# Patient Record
Sex: Female | Born: 1950 | Race: White | Hispanic: No | Marital: Married | Smoking: Never smoker
Health system: Southern US, Community
[De-identification: ages and names within clinical notes are randomized; demographics above are authoritative.]

## PROBLEM LIST (undated history)

## (undated) DIAGNOSIS — R600 Localized edema: Secondary | ICD-10-CM

## (undated) DIAGNOSIS — C801 Malignant (primary) neoplasm, unspecified: Secondary | ICD-10-CM

## (undated) DIAGNOSIS — T8859XA Other complications of anesthesia, initial encounter: Secondary | ICD-10-CM

## (undated) DIAGNOSIS — M1990 Unspecified osteoarthritis, unspecified site: Secondary | ICD-10-CM

## (undated) DIAGNOSIS — K219 Gastro-esophageal reflux disease without esophagitis: Secondary | ICD-10-CM

## (undated) DIAGNOSIS — N814 Uterovaginal prolapse, unspecified: Secondary | ICD-10-CM

## (undated) DIAGNOSIS — K649 Unspecified hemorrhoids: Secondary | ICD-10-CM

## (undated) DIAGNOSIS — T4145XA Adverse effect of unspecified anesthetic, initial encounter: Secondary | ICD-10-CM

## (undated) DIAGNOSIS — I10 Essential (primary) hypertension: Secondary | ICD-10-CM

## (undated) HISTORY — PX: HEMORRHOID SURGERY: SHX153

## (undated) HISTORY — PX: ABDOMINAL HYSTERECTOMY: SHX81

## (undated) HISTORY — PX: RHINOPLASTY: SUR1284

---

## 1986-05-25 DIAGNOSIS — C801 Malignant (primary) neoplasm, unspecified: Secondary | ICD-10-CM

## 1986-05-25 HISTORY — DX: Malignant (primary) neoplasm, unspecified: C80.1

## 1986-05-25 HISTORY — PX: MASTECTOMY: SHX3

## 1997-11-19 ENCOUNTER — Other Ambulatory Visit: Admission: RE | Admit: 1997-11-19 | Discharge: 1997-11-19 | Payer: Self-pay | Admitting: Obstetrics & Gynecology

## 1998-06-14 ENCOUNTER — Ambulatory Visit (HOSPITAL_BASED_OUTPATIENT_CLINIC_OR_DEPARTMENT_OTHER): Admission: RE | Admit: 1998-06-14 | Discharge: 1998-06-14 | Payer: Self-pay | Admitting: Specialist

## 2000-06-14 ENCOUNTER — Encounter: Payer: Self-pay | Admitting: Hematology and Oncology

## 2000-06-14 ENCOUNTER — Encounter: Admission: RE | Admit: 2000-06-14 | Discharge: 2000-06-14 | Payer: Self-pay | Admitting: Hematology and Oncology

## 2001-03-17 ENCOUNTER — Other Ambulatory Visit: Admission: RE | Admit: 2001-03-17 | Discharge: 2001-03-17 | Payer: Self-pay | Admitting: Gynecology

## 2003-06-06 ENCOUNTER — Encounter: Admission: RE | Admit: 2003-06-06 | Discharge: 2003-06-06 | Payer: Self-pay | Admitting: Internal Medicine

## 2003-11-19 ENCOUNTER — Other Ambulatory Visit: Admission: RE | Admit: 2003-11-19 | Discharge: 2003-11-19 | Payer: Self-pay | Admitting: Gynecology

## 2011-12-04 ENCOUNTER — Emergency Department (HOSPITAL_COMMUNITY): Payer: BC Managed Care – PPO

## 2011-12-04 ENCOUNTER — Encounter (HOSPITAL_COMMUNITY): Payer: Self-pay | Admitting: *Deleted

## 2011-12-04 ENCOUNTER — Emergency Department (HOSPITAL_COMMUNITY)
Admission: EM | Admit: 2011-12-04 | Discharge: 2011-12-04 | Disposition: A | Discharge status: Home / Self Care | Payer: BC Managed Care – PPO | Attending: Emergency Medicine | Admitting: Emergency Medicine

## 2011-12-04 DIAGNOSIS — W010XXA Fall on same level from slipping, tripping and stumbling without subsequent striking against object, initial encounter: Secondary | ICD-10-CM | POA: Insufficient documentation

## 2011-12-04 DIAGNOSIS — I10 Essential (primary) hypertension: Secondary | ICD-10-CM | POA: Insufficient documentation

## 2011-12-04 DIAGNOSIS — S52123A Displaced fracture of head of unspecified radius, initial encounter for closed fracture: Secondary | ICD-10-CM

## 2011-12-04 DIAGNOSIS — Z853 Personal history of malignant neoplasm of breast: Secondary | ICD-10-CM | POA: Insufficient documentation

## 2011-12-04 HISTORY — DX: Malignant (primary) neoplasm, unspecified: C80.1

## 2011-12-04 HISTORY — DX: Unspecified hemorrhoids: K64.9

## 2011-12-04 HISTORY — DX: Localized edema: R60.0

## 2011-12-04 HISTORY — DX: Essential (primary) hypertension: I10

## 2011-12-04 MED ORDER — OXYCODONE-ACETAMINOPHEN 5-325 MG PO TABS
2.0000 | ORAL_TABLET | Freq: Once | ORAL | Status: AC
  Administered 2011-12-04: 2 via ORAL
  Filled 2011-12-04: qty 2

## 2011-12-04 MED ORDER — MORPHINE SULFATE 4 MG/ML IJ SOLN
4.0000 mg | Freq: Once | INTRAMUSCULAR | Status: AC
  Administered 2011-12-04: 4 mg via INTRAMUSCULAR

## 2011-12-04 MED ORDER — MORPHINE SULFATE 4 MG/ML IJ SOLN
INTRAMUSCULAR | Status: AC
  Administered 2011-12-04: 4 mg via INTRAMUSCULAR
  Filled 2011-12-04: qty 1

## 2011-12-04 MED ORDER — OXYCODONE-ACETAMINOPHEN 5-325 MG PO TABS: 1.0000 | ORAL_TABLET | ORAL | Status: AC | PRN

## 2011-12-04 MED ORDER — IBUPROFEN 200 MG PO TABS
400.0000 mg | ORAL_TABLET | Freq: Once | ORAL | Status: AC
  Administered 2011-12-04: 400 mg via ORAL
  Filled 2011-12-04: qty 2

## 2011-12-04 NOTE — ED Notes (Signed)
Ortho bedside

## 2011-12-04 NOTE — ED Notes (Signed)
Pt returns to room, cont to monitor, awaiting results

## 2011-12-04 NOTE — ED Notes (Signed)
Transported to xray 

## 2011-12-04 NOTE — ED Notes (Signed)
Per Pt: tripped on door mat and fell backward onto concrete and struck left elbow on concrete. Pt has hx of left upper extremity lymph node removals, so swelling is mostly baseline. Pt presents to ED with intact circulation and sensation, pt is unable to turn forearm into supine position. Limited movement, no obvious deformity. Pain 9/10

## 2011-12-10 NOTE — ED Provider Notes (Signed)
History    61yF with l elbow pain. Fall from standing height. Lost balance and fell backwards. Doesn't think hit head. No LOC. Denies HA, neck or back pain. No numbness or tingling. Happened just before arrival. Pain constant since.  CSN: [id number]  Arrival date & time 12/04/11  2035   First MD Initiated Contact with Patient 12/04/11 2052      Chief Complaint  Patient presents with  . Arm Injury  . Fall    (Consider location/radiation/quality/duration/timing/severity/associated sxs/prior treatment) HPI  Past Medical History  Diagnosis Date  . Cancer     breast  . Hemorrhoids   . Hypertension   . Extremity edema     left arm restricted    Past Surgical History  Procedure Date  . Mastectomy   . Abdominal hysterectomy   . Hemorrhoid surgery     History reviewed. No pertinent family history.  History  Substance Use Topics  . Smoking status: Never Smoker   . Smokeless tobacco: Not on file  . Alcohol Use: Yes    OB History    Grav Para Term Preterm Abortions TAB SAB Ect Mult Living                  Review of Systems   Review of symptoms negative unless otherwise noted in HPI.   Allergies  Codeine  Home Medications   Current Outpatient Rx  Name Route Sig Dispense Refill  . TRIAMTERENE-HCTZ 75-50 MG PO TABS Oral Take 0.5 tablets by mouth daily.    . OXYCODONE-ACETAMINOPHEN 5-325 MG PO TABS Oral Take 1-2 tablets by mouth every 4 (four) hours as needed for pain. 20 tablet 0    BP 140/103  Pulse 73  Temp 97.8 F (36.6 C) (Oral)  Resp 22  Ht 5\' 2"  (1.575 m)  Wt 145 lb (65.772 kg)  BMI 26.52 kg/m2  SpO2 98%  Physical Exam  Nursing note and vitals reviewed. Constitutional: She appears well-developed and well-nourished. No distress.  HENT:  Head: Normocephalic and atraumatic.  Eyes: Conjunctivae are normal. Right eye exhibits no discharge. Left eye exhibits no discharge.  Neck: Neck supple.  Cardiovascular: Normal rate, regular rhythm and normal  heart sounds.  Exam reveals no gallop and no friction rub.   No murmur heard. Pulmonary/Chest: Effort normal and breath sounds normal. No respiratory distress.  Abdominal: Soft. She exhibits no distension. There is no tenderness.  Musculoskeletal: She exhibits no edema and no tenderness.       No midline spinal tenderness. Diffuse L elbow pain.limited ROM with flex/ext and forearm supination/pronation 2/2 pain. Neurovascularly intact distally. Skin intact.  Neurological: She is alert.  Skin: Skin is warm and dry.  Psychiatric: She has a normal mood and affect. Her behavior is normal. Thought content normal.    ED Course  Procedures (including critical care time)  Labs Reviewed - No data to display No results found.  Dg Elbow Complete Left  12/04/2011  *RADIOLOGY REPORT*  Clinical Data: Fall.  LEFT ELBOW - COMPLETE 3+ VIEW  Comparison: None.  Findings: Radial head fracture with incongruity of the articular surface.  Coronoid process fracture.  Joint effusion.  IMPRESSION:  Radial head fracture with incongruity of the articular surface.  Coronoid process fracture.  Joint effusion.  Original Report Authenticated By: Fuller Canada, M.D.    1. Radial head fracture, closed   2. Coronoid Process Fracture    MDM  61yF with closed L radial head and coronoid process fractures. Neurovascularly intact.  Plan splint, pain meds and ortho fu.        Raeford Razor, MD 12/10/11 [phone]

## 2014-05-14 ENCOUNTER — Other Ambulatory Visit: Payer: Self-pay | Admitting: Internal Medicine

## 2014-05-14 ENCOUNTER — Ambulatory Visit
Admission: RE | Admit: 2014-05-14 | Discharge: 2014-05-14 | Disposition: A | Discharge status: Home / Self Care | Payer: BC Managed Care – PPO | Source: Ambulatory Visit | Attending: Internal Medicine | Admitting: Internal Medicine

## 2014-05-14 DIAGNOSIS — C50912 Malignant neoplasm of unspecified site of left female breast: Secondary | ICD-10-CM

## 2015-11-11 DIAGNOSIS — Z853 Personal history of malignant neoplasm of breast: Secondary | ICD-10-CM | POA: Diagnosis not present

## 2015-11-11 DIAGNOSIS — N8189 Other female genital prolapse: Secondary | ICD-10-CM | POA: Diagnosis not present

## 2015-11-11 DIAGNOSIS — Z6827 Body mass index (BMI) 27.0-27.9, adult: Secondary | ICD-10-CM | POA: Diagnosis not present

## 2015-11-11 DIAGNOSIS — N959 Unspecified menopausal and perimenopausal disorder: Secondary | ICD-10-CM | POA: Diagnosis not present

## 2015-11-13 DIAGNOSIS — M8588 Other specified disorders of bone density and structure, other site: Secondary | ICD-10-CM | POA: Diagnosis not present

## 2015-11-13 DIAGNOSIS — I10 Essential (primary) hypertension: Secondary | ICD-10-CM | POA: Diagnosis not present

## 2015-12-10 DIAGNOSIS — N939 Abnormal uterine and vaginal bleeding, unspecified: Secondary | ICD-10-CM | POA: Diagnosis not present

## 2016-02-04 DIAGNOSIS — H25043 Posterior subcapsular polar age-related cataract, bilateral: Secondary | ICD-10-CM | POA: Diagnosis not present

## 2016-02-04 DIAGNOSIS — H524 Presbyopia: Secondary | ICD-10-CM | POA: Diagnosis not present

## 2016-03-11 DIAGNOSIS — D225 Melanocytic nevi of trunk: Secondary | ICD-10-CM | POA: Diagnosis not present

## 2016-03-11 DIAGNOSIS — D2272 Melanocytic nevi of left lower limb, including hip: Secondary | ICD-10-CM | POA: Diagnosis not present

## 2016-03-11 DIAGNOSIS — L7211 Pilar cyst: Secondary | ICD-10-CM | POA: Diagnosis not present

## 2016-03-11 DIAGNOSIS — D1801 Hemangioma of skin and subcutaneous tissue: Secondary | ICD-10-CM | POA: Diagnosis not present

## 2016-03-11 DIAGNOSIS — L821 Other seborrheic keratosis: Secondary | ICD-10-CM | POA: Diagnosis not present

## 2016-04-07 IMAGING — CR DG CHEST 2V
2 series · 2 of 2 positions shown · non-contrast
Comparison: 09/18/2009

CLINICAL DATA: Personal history of mastectomy 27 years ago for
treatment of breast cancer. Routine checkup.

EXAM:
CHEST  2 VIEW

[w chest pa]
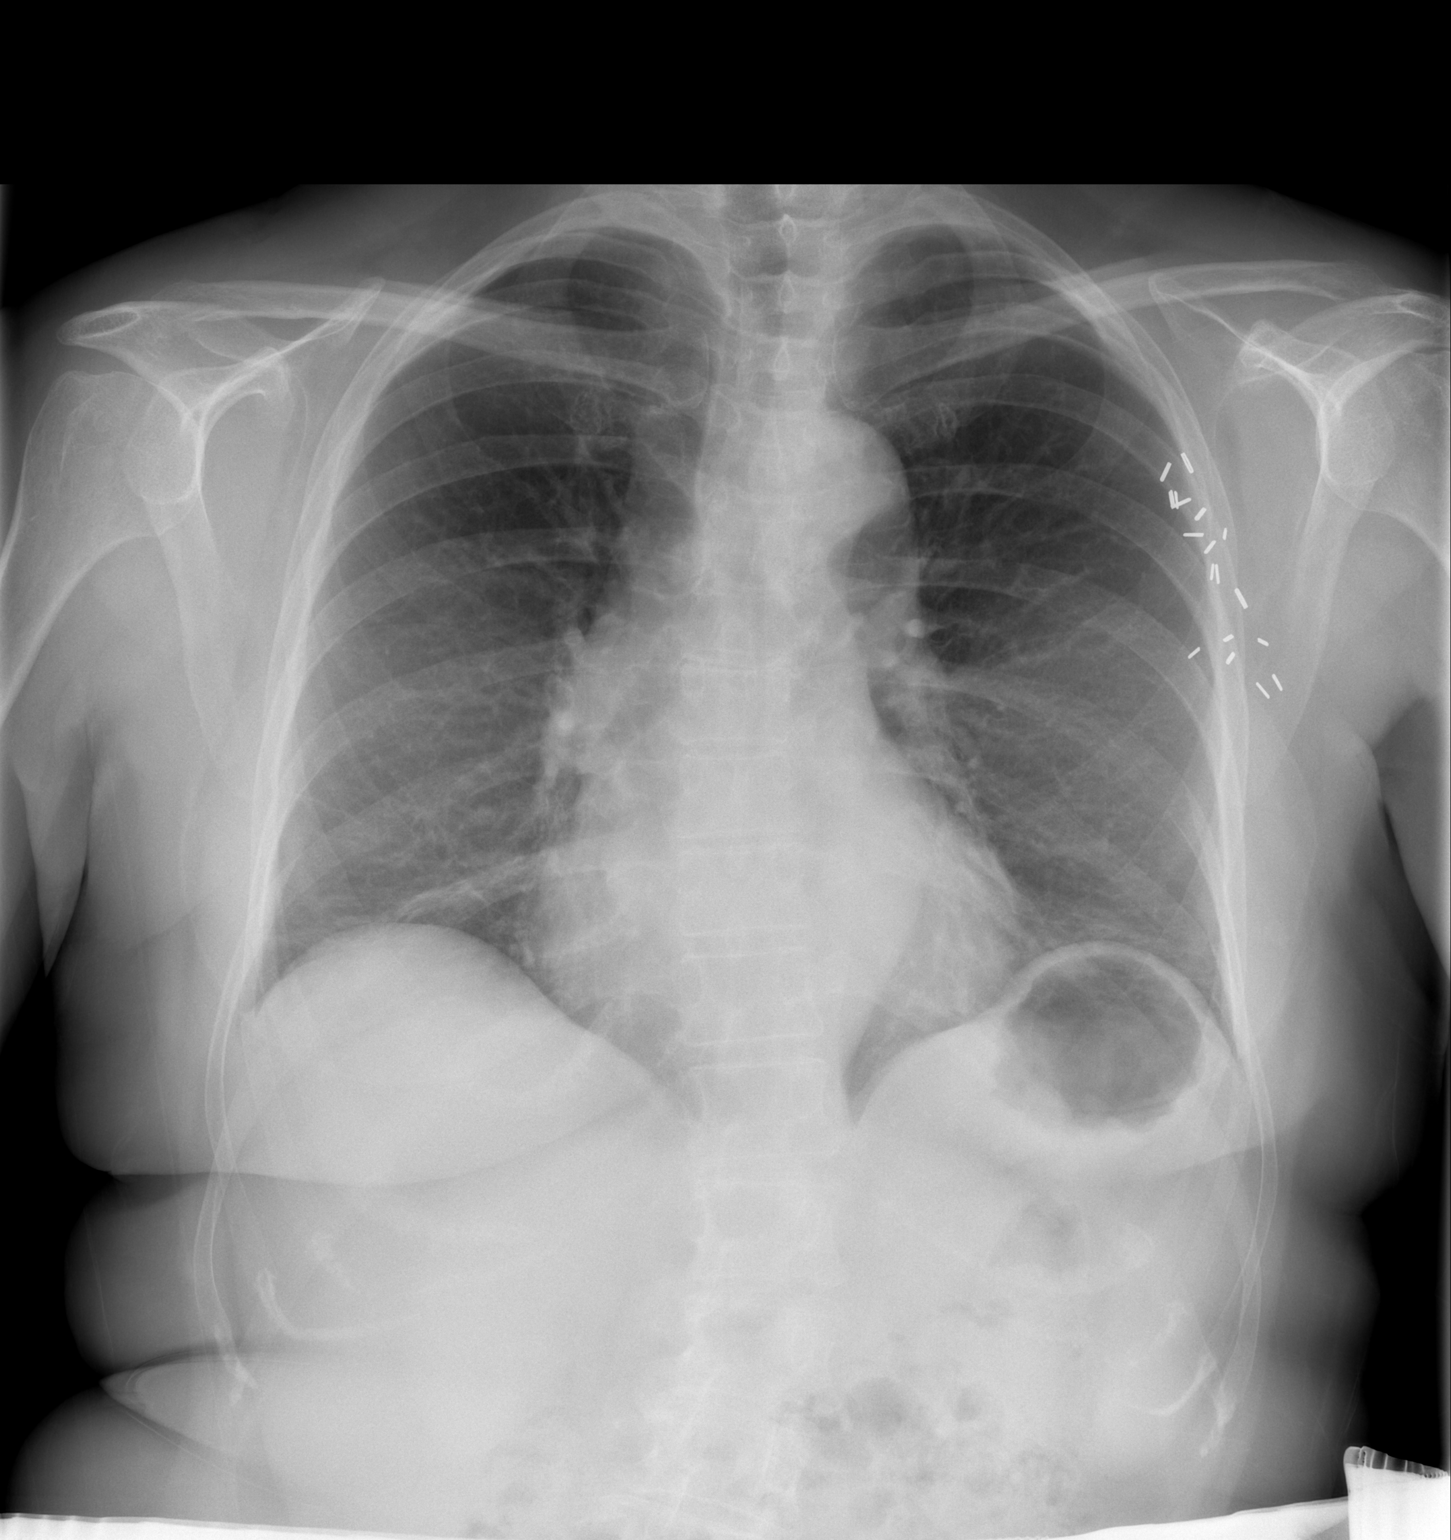

[w chest lat]
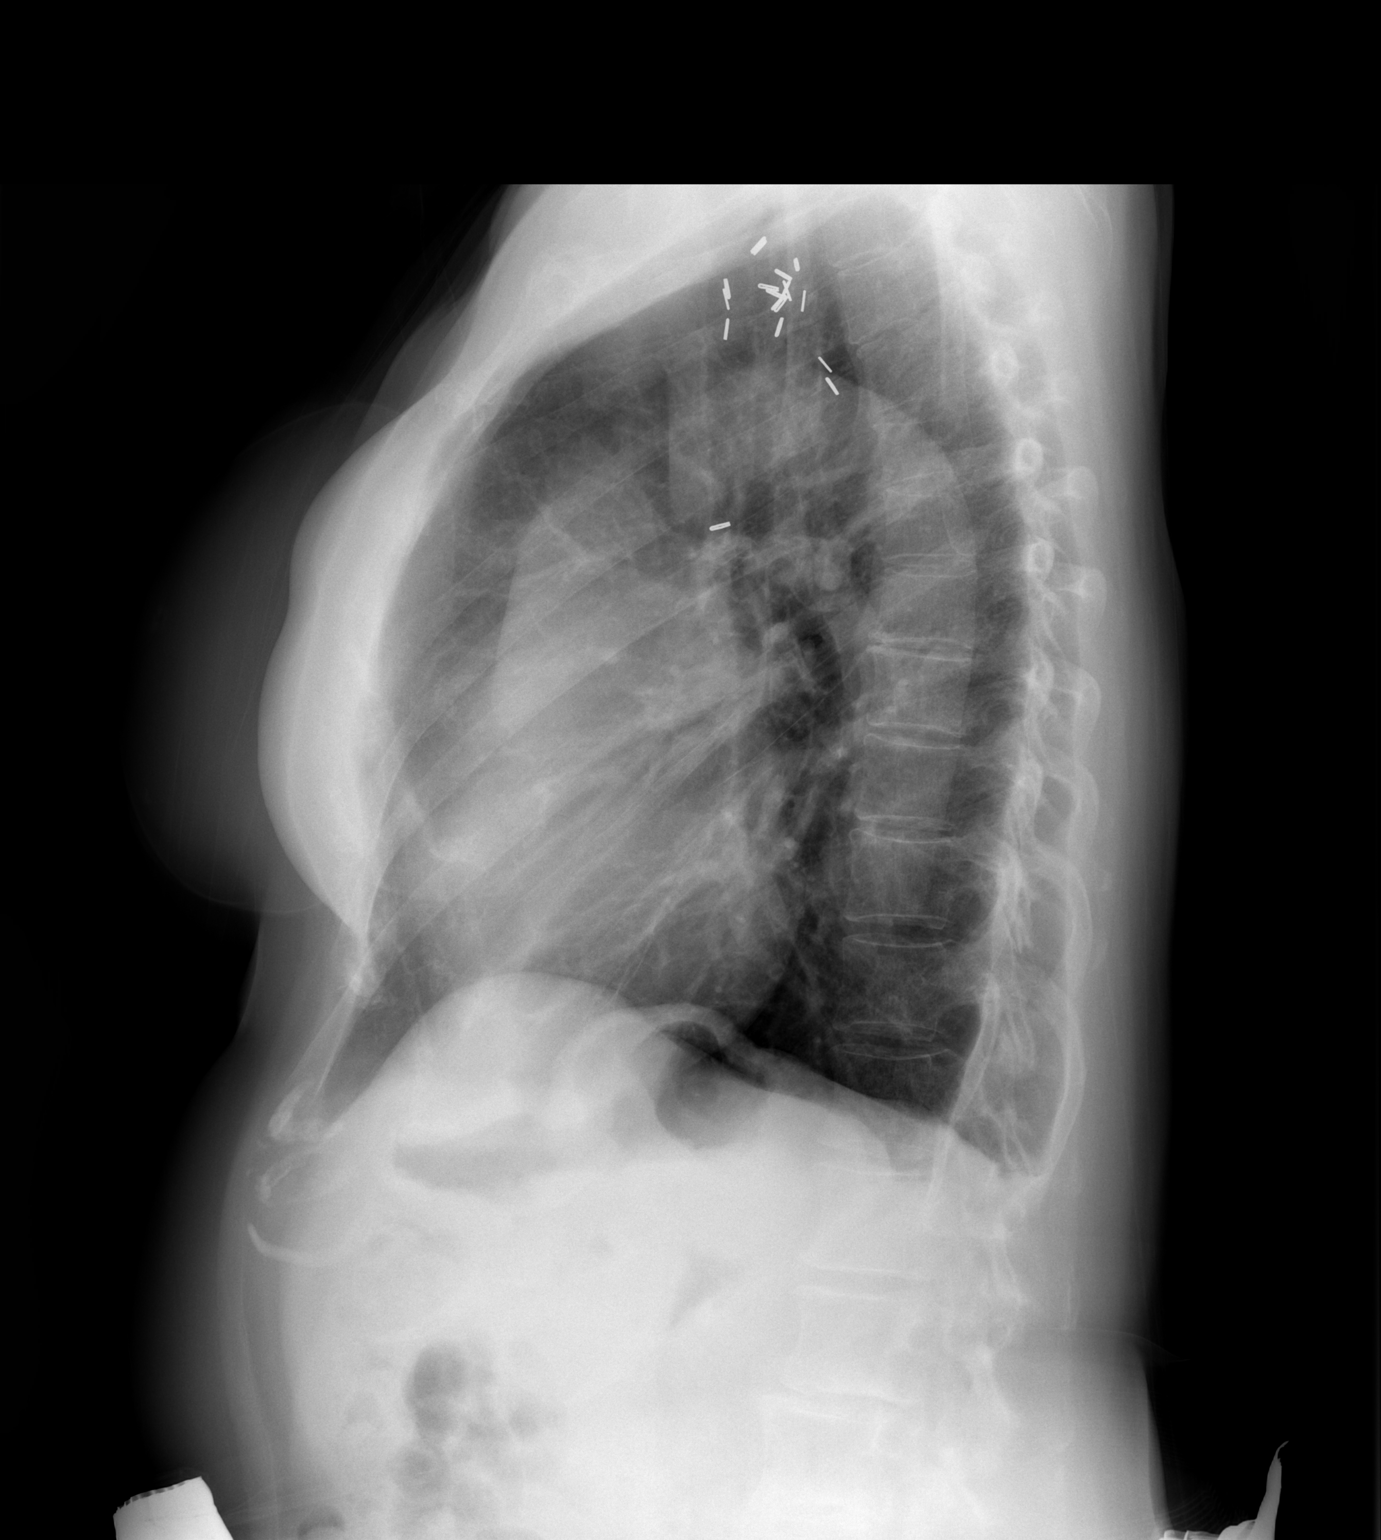

[2 of 2 positions shown; findings below may reference images not displayed]

FINDINGS: Heart size is normal. There is calcification and unfolding of the
aorta. There is mild scarring at both lung bases. No effusion. No
evidence of active infiltrate, mass or collapse. Previous mastectomy
with reconstructions as seen previously. Multiple surgical clips in
the left exit a low.
IMPRESSION: No active disease. Postoperative changes. Mild scarring at the lung
bases.

## 2016-05-03 DIAGNOSIS — Z23 Encounter for immunization: Secondary | ICD-10-CM | POA: Diagnosis not present

## 2016-05-07 DIAGNOSIS — R7309 Other abnormal glucose: Secondary | ICD-10-CM | POA: Diagnosis not present

## 2016-05-07 DIAGNOSIS — M8580 Other specified disorders of bone density and structure, unspecified site: Secondary | ICD-10-CM | POA: Diagnosis not present

## 2016-05-07 DIAGNOSIS — Z1389 Encounter for screening for other disorder: Secondary | ICD-10-CM | POA: Diagnosis not present

## 2016-05-07 DIAGNOSIS — E785 Hyperlipidemia, unspecified: Secondary | ICD-10-CM | POA: Diagnosis not present

## 2016-05-07 DIAGNOSIS — Z0000 Encounter for general adult medical examination without abnormal findings: Secondary | ICD-10-CM | POA: Diagnosis not present

## 2016-05-07 DIAGNOSIS — D0371 Melanoma in situ of right lower limb, including hip: Secondary | ICD-10-CM | POA: Diagnosis not present

## 2016-05-07 DIAGNOSIS — Z1159 Encounter for screening for other viral diseases: Secondary | ICD-10-CM | POA: Diagnosis not present

## 2016-05-07 DIAGNOSIS — I10 Essential (primary) hypertension: Secondary | ICD-10-CM | POA: Diagnosis not present

## 2016-05-07 DIAGNOSIS — Z23 Encounter for immunization: Secondary | ICD-10-CM | POA: Diagnosis not present

## 2016-05-07 DIAGNOSIS — C50912 Malignant neoplasm of unspecified site of left female breast: Secondary | ICD-10-CM | POA: Diagnosis not present

## 2016-06-23 DIAGNOSIS — I10 Essential (primary) hypertension: Secondary | ICD-10-CM | POA: Diagnosis not present

## 2016-07-04 DIAGNOSIS — J309 Allergic rhinitis, unspecified: Secondary | ICD-10-CM | POA: Diagnosis not present

## 2016-07-09 ENCOUNTER — Other Ambulatory Visit: Payer: Self-pay | Admitting: Gastroenterology

## 2016-09-09 DIAGNOSIS — D485 Neoplasm of uncertain behavior of skin: Secondary | ICD-10-CM | POA: Diagnosis not present

## 2016-09-09 DIAGNOSIS — D2272 Melanocytic nevi of left lower limb, including hip: Secondary | ICD-10-CM | POA: Diagnosis not present

## 2016-09-09 DIAGNOSIS — L821 Other seborrheic keratosis: Secondary | ICD-10-CM | POA: Diagnosis not present

## 2016-09-09 DIAGNOSIS — D225 Melanocytic nevi of trunk: Secondary | ICD-10-CM | POA: Diagnosis not present

## 2016-09-09 DIAGNOSIS — D2239 Melanocytic nevi of other parts of face: Secondary | ICD-10-CM | POA: Diagnosis not present

## 2016-09-21 ENCOUNTER — Encounter (HOSPITAL_COMMUNITY): Payer: Self-pay | Admitting: *Deleted

## 2016-09-22 ENCOUNTER — Encounter (HOSPITAL_COMMUNITY): Payer: Self-pay | Admitting: *Deleted

## 2016-09-22 ENCOUNTER — Ambulatory Visit (HOSPITAL_COMMUNITY)
Admission: RE | Admit: 2016-09-22 | Discharge: 2016-09-22 | Disposition: A | Discharge status: Home / Self Care | Payer: Medicare Other | Source: Ambulatory Visit | Attending: Gastroenterology | Admitting: Gastroenterology

## 2016-09-22 ENCOUNTER — Ambulatory Visit (HOSPITAL_COMMUNITY): Payer: Medicare Other | Admitting: Anesthesiology

## 2016-09-22 ENCOUNTER — Encounter (HOSPITAL_COMMUNITY)
Admission: RE | Disposition: A | Discharge status: Home / Self Care | Payer: Self-pay | Source: Ambulatory Visit | Attending: Gastroenterology

## 2016-09-22 DIAGNOSIS — Z853 Personal history of malignant neoplasm of breast: Secondary | ICD-10-CM | POA: Diagnosis not present

## 2016-09-22 DIAGNOSIS — Z79899 Other long term (current) drug therapy: Secondary | ICD-10-CM | POA: Insufficient documentation

## 2016-09-22 DIAGNOSIS — D120 Benign neoplasm of cecum: Secondary | ICD-10-CM | POA: Insufficient documentation

## 2016-09-22 DIAGNOSIS — E7800 Pure hypercholesterolemia, unspecified: Secondary | ICD-10-CM | POA: Insufficient documentation

## 2016-09-22 DIAGNOSIS — Z1211 Encounter for screening for malignant neoplasm of colon: Secondary | ICD-10-CM | POA: Insufficient documentation

## 2016-09-22 DIAGNOSIS — Z9013 Acquired absence of bilateral breasts and nipples: Secondary | ICD-10-CM | POA: Diagnosis not present

## 2016-09-22 DIAGNOSIS — I10 Essential (primary) hypertension: Secondary | ICD-10-CM | POA: Diagnosis not present

## 2016-09-22 HISTORY — DX: Gastro-esophageal reflux disease without esophagitis: K21.9

## 2016-09-22 HISTORY — DX: Other complications of anesthesia, initial encounter: T88.59XA

## 2016-09-22 HISTORY — DX: Unspecified osteoarthritis, unspecified site: M19.90

## 2016-09-22 HISTORY — DX: Adverse effect of unspecified anesthetic, initial encounter: T41.45XA

## 2016-09-22 HISTORY — PX: COLONOSCOPY WITH PROPOFOL: SHX5780

## 2016-09-22 HISTORY — DX: Uterovaginal prolapse, unspecified: N81.4

## 2016-09-22 SURGERY — COLONOSCOPY WITH PROPOFOL
Anesthesia: Monitor Anesthesia Care

## 2016-09-22 MED ORDER — PROPOFOL 500 MG/50ML IV EMUL
INTRAVENOUS | Status: DC | PRN
  Administered 2016-09-22: 150 ug/kg/min via INTRAVENOUS

## 2016-09-22 MED ORDER — LACTATED RINGERS IV SOLN
INTRAVENOUS | Status: DC
  Administered 2016-09-22: 12:00:00 via INTRAVENOUS

## 2016-09-22 MED ORDER — PROPOFOL 500 MG/50ML IV EMUL
INTRAVENOUS | Status: DC | PRN
  Administered 2016-09-22: 50 mg via INTRAVENOUS

## 2016-09-22 MED ORDER — PROPOFOL 10 MG/ML IV BOLUS
INTRAVENOUS | Status: AC
  Filled 2016-09-22: qty 20

## 2016-09-22 MED ORDER — SODIUM CHLORIDE 0.9 % IV SOLN: INTRAVENOUS | Status: DC

## 2016-09-22 SURGICAL SUPPLY — 21 items

## 2016-09-22 NOTE — Op Note (Signed)
[facility] Patient Name: Deborah Hernandez Procedure Date: 09/22/2016 MRN: [id number] Attending MD: Garlan Fair , MD Date of Birth: 02-10-1951 CSN: [id number] Age: 66 Admit Type: Outpatient Procedure:                Colonoscopy Indications:              Screening for colorectal malignant neoplasm Providers:                Garlan Fair, MD, Laverta Baltimore RN, RN,                            Cherylynn Ridges, Technician, Arnoldo Hooker, CRNA Referring MD:              Medicines:                Propofol per Anesthesia Complications:            No immediate complications. Estimated Blood Loss:     Estimated blood loss was minimal. Procedure:                Pre-Anesthesia Assessment:                           - Prior to the procedure, a History and Physical                            was performed, and patient medications and                            allergies were reviewed. The patient's tolerance of                            previous anesthesia was also reviewed. The risks                            and benefits of the procedure and the sedation                            options and risks were discussed with the patient.                            All questions were answered, and informed consent                            was obtained. Prior Anticoagulants: The patient has                            taken no previous anticoagulant or antiplatelet                            agents. ASA Grade Assessment: II - A patient with                            mild systemic disease. After reviewing the risks  and benefits, the patient was deemed in                            satisfactory condition to undergo the procedure.                           After obtaining informed consent, the colonoscope                            was passed under direct vision. Throughout the                            procedure, the patient's blood pressure, pulse, and                           oxygen saturations were monitored continuously. The                            EC-[id number] ([id number]) scope was introduced through                            the anus and advanced to the the cecum, identified                            by appendiceal orifice and ileocecal valve. The                            colonoscopy was somewhat difficult due to                            significant looping. The patient tolerated the                            procedure well. The quality of the bowel                            preparation was good. The terminal ileum, the                            ileocecal valve, the appendiceal orifice and the                            rectum were photographed. Scope In: 12:12:23 PM Scope Out: 12:31:18 PM Scope Withdrawal Time: 0 hours 10 minutes 40 seconds  Total Procedure Duration: 0 hours 18 minutes 55 seconds  Findings:      The perianal and digital rectal examinations were normal.      A 5 mm polyp was found in the cecum. The polyp was sessile. The polyp       was removed with a cold snare. Resection and retrieval were complete.      The exam was otherwise without abnormality. Impression:               - One 5 mm polyp in the cecum, removed with a cold  snare. Resected and retrieved.                           - The examination was otherwise normal. Moderate Sedation:      N/A- Per Anesthesia Care Recommendation:           - Patient has a contact number available for                            emergencies. The signs and symptoms of potential                            delayed complications were discussed with the                            patient. Return to normal activities tomorrow.                            Written discharge instructions were provided to the                            patient.                           - Repeat colonoscopy date to be determined after                            pending  pathology results are reviewed for                            surveillance.                           - Resume previous diet.                           - Continue present medications. Procedure Code(s):        --- Professional ---                           [phone], Colonoscopy, flexible; with removal of                            tumor(s), polyp(s), or other lesion(s) by snare                            technique Diagnosis Code(s):        --- Professional ---                           Z12.11, Encounter for screening for malignant                            neoplasm of colon                           D12.0, Benign neoplasm of cecum CPT copyright 2016 American Medical  Association. All rights reserved. The codes documented in this report are preliminary and upon coder review may  be revised to meet current compliance requirements. Earle Gell, MD Garlan Fair, MD 09/22/2016 12:38:11 PM This report has been signed electronically. Number of Addenda: 0

## 2016-09-22 NOTE — H&P (Signed)
Procedure: Screening colonoscopy. Normal screening colonoscopy was performed on 07/13/2005  History: The patient is a 66 year old female born 05/11/1951. She is scheduled to undergo a repeat screening colonoscopy today.  Past medical history: Melanoma in situ removed from the right leg in 2014. Total abdominal hysterectomy with BSO. Vocal cord polyp surgery. Hemorrhoidectomy. Saline breast implants. Breast cancer. Bilateral mastectomy. Hypertension. Hypercholesterolemia.  Medication allergies: Trimethoprim sulfamethoxazole caused skin rash.  Exam: The patient is alert and lying comfortably on the endoscopy stretcher. Abdomen is soft and nontender to palpation. Lungs are clear to auscultation. Cardiac exam reveals a regular rhythm.  Plan: Proceed with screening colonoscopy

## 2016-09-22 NOTE — Anesthesia Preprocedure Evaluation (Signed)
Anesthesia Evaluation  Patient identified by MRN, date of birth, ID band Patient awake    Reviewed: Allergy & Precautions, NPO status , Patient's Chart, lab work & pertinent test results  Airway Mallampati: II  TM Distance: >3 FB Neck ROM: Full    Dental no notable dental hx.    Pulmonary neg pulmonary ROS,    Pulmonary exam normal breath sounds clear to auscultation       Cardiovascular hypertension, Normal cardiovascular exam Rhythm:Regular Rate:Normal     Neuro/Psych negative neurological ROS  negative psych ROS   GI/Hepatic negative GI ROS, Neg liver ROS,   Endo/Other  negative endocrine ROS  Renal/GU negative Renal ROS  negative genitourinary   Musculoskeletal negative musculoskeletal ROS (+)   Abdominal   Peds negative pediatric ROS (+)  Hematology negative hematology ROS (+)   Anesthesia Other Findings   Reproductive/Obstetrics negative OB ROS                             Anesthesia Physical Anesthesia Plan  ASA: II  Anesthesia Plan: MAC   Post-op Pain Management:    Induction: Intravenous  Airway Management Planned: Nasal Cannula  Additional Equipment:   Intra-op Plan:   Post-operative Plan:   Informed Consent: I have reviewed the patients History and Physical, chart, labs and discussed the procedure including the risks, benefits and alternatives for the proposed anesthesia with the patient or authorized representative who has indicated his/her understanding and acceptance.   Dental advisory given  Plan Discussed with: CRNA and Surgeon  Anesthesia Plan Comments:         Anesthesia Quick Evaluation  

## 2016-09-22 NOTE — Discharge Instructions (Signed)

## 2016-09-22 NOTE — Transfer of Care (Signed)
Immediate Anesthesia Transfer of Care Note  Patient: Deborah Hernandez  Procedure(s) Performed: Procedure(s): COLONOSCOPY WITH PROPOFOL (N/A)  Patient Location: PACU  Anesthesia Type:MAC  Level of Consciousness:  sedated, patient cooperative and responds to stimulation  Airway & Oxygen Therapy:Patient Spontanous Breathing and Patient connected to face mask oxgen  Post-op Assessment:  Report given to PACU RN and Post -op Vital signs reviewed and stable  Post vital signs:  Reviewed and stable  Last Vitals:  Vitals:   09/22/16 1143  BP: (!) 150/90  Pulse: 80  Resp: (!) 25  Temp: 80.0 C    Complications: No apparent anesthesia complications

## 2016-09-23 NOTE — Anesthesia Postprocedure Evaluation (Signed)
Anesthesia Post Note  Patient: Deborah Hernandez  Procedure(s) Performed: Procedure(s) (LRB): COLONOSCOPY WITH PROPOFOL (N/A)  Patient location during evaluation: PACU Anesthesia Type: MAC Level of consciousness: awake and alert Pain management: pain level controlled Vital Signs Assessment: post-procedure vital signs reviewed and stable Respiratory status: spontaneous breathing, nonlabored ventilation, respiratory function stable and patient connected to nasal cannula oxygen Cardiovascular status: stable and blood pressure returned to baseline Anesthetic complications: no       Last Vitals:  Vitals:   09/22/16 1300 09/22/16 1310  BP: (!) 154/82 (!) 171/86  Pulse: 64   Resp: 17 11  Temp:      Last Pain:  Vitals:   09/22/16 1239  TempSrc: Oral                 Marcey Persad S

## 2016-09-24 ENCOUNTER — Encounter (HOSPITAL_COMMUNITY): Payer: Self-pay | Admitting: Gastroenterology

## 2016-10-26 NOTE — Addendum Note (Signed)
Addendum  created 10/26/16 1410 by Myrtie Soman, MD   Sign clinical note

## 2016-10-26 NOTE — Anesthesia Postprocedure Evaluation (Signed)
Anesthesia Post Note  Patient: Deborah Hernandez  Procedure(s) Performed: Procedure(s) (LRB): COLONOSCOPY WITH PROPOFOL (N/A)     Anesthesia Post Evaluation  Last Vitals:  Vitals:   09/22/16 1300 09/22/16 1310  BP: (!) 154/82 (!) 171/86  Pulse: 64   Resp: 17 11  Temp:      Last Pain:  Vitals:   09/22/16 1239  TempSrc: Oral                 Mallisa Alameda S

## 2016-12-24 DIAGNOSIS — E785 Hyperlipidemia, unspecified: Secondary | ICD-10-CM | POA: Diagnosis not present

## 2016-12-24 DIAGNOSIS — M8580 Other specified disorders of bone density and structure, unspecified site: Secondary | ICD-10-CM | POA: Diagnosis not present

## 2016-12-24 DIAGNOSIS — D0371 Melanoma in situ of right lower limb, including hip: Secondary | ICD-10-CM | POA: Diagnosis not present

## 2016-12-24 DIAGNOSIS — I10 Essential (primary) hypertension: Secondary | ICD-10-CM | POA: Diagnosis not present

## 2016-12-24 DIAGNOSIS — Z1389 Encounter for screening for other disorder: Secondary | ICD-10-CM | POA: Diagnosis not present

## 2016-12-24 DIAGNOSIS — Z853 Personal history of malignant neoplasm of breast: Secondary | ICD-10-CM | POA: Diagnosis not present

## 2017-03-05 DIAGNOSIS — D692 Other nonthrombocytopenic purpura: Secondary | ICD-10-CM | POA: Diagnosis not present

## 2017-03-05 DIAGNOSIS — Z85820 Personal history of malignant melanoma of skin: Secondary | ICD-10-CM | POA: Diagnosis not present

## 2017-03-05 DIAGNOSIS — D2272 Melanocytic nevi of left lower limb, including hip: Secondary | ICD-10-CM | POA: Diagnosis not present

## 2017-03-05 DIAGNOSIS — D2239 Melanocytic nevi of other parts of face: Secondary | ICD-10-CM | POA: Diagnosis not present

## 2017-03-05 DIAGNOSIS — L821 Other seborrheic keratosis: Secondary | ICD-10-CM | POA: Diagnosis not present

## 2017-04-05 DIAGNOSIS — Z23 Encounter for immunization: Secondary | ICD-10-CM | POA: Diagnosis not present

## 2017-05-11 DIAGNOSIS — C50912 Malignant neoplasm of unspecified site of left female breast: Secondary | ICD-10-CM | POA: Diagnosis not present

## 2017-05-11 DIAGNOSIS — I10 Essential (primary) hypertension: Secondary | ICD-10-CM | POA: Diagnosis not present

## 2017-05-11 DIAGNOSIS — Z0000 Encounter for general adult medical examination without abnormal findings: Secondary | ICD-10-CM | POA: Diagnosis not present

## 2017-05-11 DIAGNOSIS — D0370 Melanoma in situ of unspecified lower limb, including hip: Secondary | ICD-10-CM | POA: Diagnosis not present

## 2017-05-11 DIAGNOSIS — M8580 Other specified disorders of bone density and structure, unspecified site: Secondary | ICD-10-CM | POA: Diagnosis not present

## 2017-05-11 DIAGNOSIS — E785 Hyperlipidemia, unspecified: Secondary | ICD-10-CM | POA: Diagnosis not present

## 2017-05-11 DIAGNOSIS — Z1389 Encounter for screening for other disorder: Secondary | ICD-10-CM | POA: Diagnosis not present

## 2017-05-11 DIAGNOSIS — R7309 Other abnormal glucose: Secondary | ICD-10-CM | POA: Diagnosis not present

## 2017-06-07 DIAGNOSIS — L820 Inflamed seborrheic keratosis: Secondary | ICD-10-CM | POA: Diagnosis not present

## 2017-06-07 DIAGNOSIS — L821 Other seborrheic keratosis: Secondary | ICD-10-CM | POA: Diagnosis not present

## 2017-07-13 DIAGNOSIS — R350 Frequency of micturition: Secondary | ICD-10-CM | POA: Diagnosis not present

## 2017-07-13 DIAGNOSIS — Z6827 Body mass index (BMI) 27.0-27.9, adult: Secondary | ICD-10-CM | POA: Diagnosis not present

## 2017-07-13 DIAGNOSIS — Z01411 Encounter for gynecological examination (general) (routine) with abnormal findings: Secondary | ICD-10-CM | POA: Diagnosis not present

## 2017-09-01 DIAGNOSIS — L821 Other seborrheic keratosis: Secondary | ICD-10-CM | POA: Diagnosis not present

## 2017-09-01 DIAGNOSIS — D2272 Melanocytic nevi of left lower limb, including hip: Secondary | ICD-10-CM | POA: Diagnosis not present

## 2017-09-01 DIAGNOSIS — D485 Neoplasm of uncertain behavior of skin: Secondary | ICD-10-CM | POA: Diagnosis not present

## 2017-09-01 DIAGNOSIS — D1801 Hemangioma of skin and subcutaneous tissue: Secondary | ICD-10-CM | POA: Diagnosis not present

## 2017-09-01 DIAGNOSIS — L919 Hypertrophic disorder of the skin, unspecified: Secondary | ICD-10-CM | POA: Diagnosis not present

## 2017-11-10 DIAGNOSIS — M8580 Other specified disorders of bone density and structure, unspecified site: Secondary | ICD-10-CM | POA: Diagnosis not present

## 2017-11-10 DIAGNOSIS — I10 Essential (primary) hypertension: Secondary | ICD-10-CM | POA: Diagnosis not present

## 2017-11-10 DIAGNOSIS — E785 Hyperlipidemia, unspecified: Secondary | ICD-10-CM | POA: Diagnosis not present

## 2017-11-10 DIAGNOSIS — D0371 Melanoma in situ of right lower limb, including hip: Secondary | ICD-10-CM | POA: Diagnosis not present

## 2017-11-10 DIAGNOSIS — C50912 Malignant neoplasm of unspecified site of left female breast: Secondary | ICD-10-CM | POA: Diagnosis not present

## 2018-03-15 DIAGNOSIS — D692 Other nonthrombocytopenic purpura: Secondary | ICD-10-CM | POA: Diagnosis not present

## 2018-03-15 DIAGNOSIS — L821 Other seborrheic keratosis: Secondary | ICD-10-CM | POA: Diagnosis not present

## 2018-03-15 DIAGNOSIS — D1801 Hemangioma of skin and subcutaneous tissue: Secondary | ICD-10-CM | POA: Diagnosis not present

## 2018-04-01 DIAGNOSIS — Z23 Encounter for immunization: Secondary | ICD-10-CM | POA: Diagnosis not present

## 2018-05-13 DIAGNOSIS — N390 Urinary tract infection, site not specified: Secondary | ICD-10-CM | POA: Diagnosis not present

## 2018-05-31 DIAGNOSIS — Z23 Encounter for immunization: Secondary | ICD-10-CM | POA: Diagnosis not present

## 2018-05-31 DIAGNOSIS — C50912 Malignant neoplasm of unspecified site of left female breast: Secondary | ICD-10-CM | POA: Diagnosis not present

## 2018-05-31 DIAGNOSIS — I10 Essential (primary) hypertension: Secondary | ICD-10-CM | POA: Diagnosis not present

## 2018-05-31 DIAGNOSIS — Z0000 Encounter for general adult medical examination without abnormal findings: Secondary | ICD-10-CM | POA: Diagnosis not present

## 2018-05-31 DIAGNOSIS — R7309 Other abnormal glucose: Secondary | ICD-10-CM | POA: Diagnosis not present

## 2018-05-31 DIAGNOSIS — M8580 Other specified disorders of bone density and structure, unspecified site: Secondary | ICD-10-CM | POA: Diagnosis not present

## 2018-05-31 DIAGNOSIS — D0370 Melanoma in situ of unspecified lower limb, including hip: Secondary | ICD-10-CM | POA: Diagnosis not present

## 2018-05-31 DIAGNOSIS — E785 Hyperlipidemia, unspecified: Secondary | ICD-10-CM | POA: Diagnosis not present

## 2018-05-31 DIAGNOSIS — Z1389 Encounter for screening for other disorder: Secondary | ICD-10-CM | POA: Diagnosis not present

## 2018-06-03 DIAGNOSIS — R918 Other nonspecific abnormal finding of lung field: Secondary | ICD-10-CM | POA: Diagnosis not present

## 2018-06-03 DIAGNOSIS — J323 Chronic sphenoidal sinusitis: Secondary | ICD-10-CM | POA: Diagnosis not present

## 2018-06-03 DIAGNOSIS — G0490 Encephalitis and encephalomyelitis, unspecified: Secondary | ICD-10-CM | POA: Diagnosis not present

## 2018-06-03 DIAGNOSIS — Z9911 Dependence on respirator [ventilator] status: Secondary | ICD-10-CM | POA: Diagnosis not present

## 2018-06-03 DIAGNOSIS — Z452 Encounter for adjustment and management of vascular access device: Secondary | ICD-10-CM | POA: Diagnosis not present

## 2018-06-03 DIAGNOSIS — E876 Hypokalemia: Secondary | ICD-10-CM | POA: Diagnosis not present

## 2018-06-03 DIAGNOSIS — R509 Fever, unspecified: Secondary | ICD-10-CM | POA: Diagnosis not present

## 2018-06-03 DIAGNOSIS — T886XXA Anaphylactic reaction due to adverse effect of correct drug or medicament properly administered, initial encounter: Secondary | ICD-10-CM | POA: Diagnosis not present

## 2018-06-03 DIAGNOSIS — R079 Chest pain, unspecified: Secondary | ICD-10-CM | POA: Diagnosis not present

## 2018-06-03 DIAGNOSIS — I517 Cardiomegaly: Secondary | ICD-10-CM | POA: Diagnosis not present

## 2018-06-03 DIAGNOSIS — J90 Pleural effusion, not elsewhere classified: Secondary | ICD-10-CM | POA: Diagnosis not present

## 2018-06-03 DIAGNOSIS — G40219 Localization-related (focal) (partial) symptomatic epilepsy and epileptic syndromes with complex partial seizures, intractable, without status epilepticus: Secondary | ICD-10-CM | POA: Diagnosis present

## 2018-06-03 DIAGNOSIS — I351 Nonrheumatic aortic (valve) insufficiency: Secondary | ICD-10-CM | POA: Diagnosis not present

## 2018-06-03 DIAGNOSIS — J189 Pneumonia, unspecified organism: Secondary | ICD-10-CM | POA: Diagnosis not present

## 2018-06-03 DIAGNOSIS — I878 Other specified disorders of veins: Secondary | ICD-10-CM | POA: Diagnosis not present

## 2018-06-03 DIAGNOSIS — I340 Nonrheumatic mitral (valve) insufficiency: Secondary | ICD-10-CM | POA: Diagnosis not present

## 2018-06-03 DIAGNOSIS — I67848 Other cerebrovascular vasospasm and vasoconstriction: Secondary | ICD-10-CM | POA: Diagnosis not present

## 2018-06-03 DIAGNOSIS — G912 (Idiopathic) normal pressure hydrocephalus: Secondary | ICD-10-CM | POA: Diagnosis not present

## 2018-06-03 DIAGNOSIS — Z79899 Other long term (current) drug therapy: Secondary | ICD-10-CM | POA: Diagnosis not present

## 2018-06-03 DIAGNOSIS — Z4541 Encounter for adjustment and management of cerebrospinal fluid drainage device: Secondary | ICD-10-CM | POA: Diagnosis not present

## 2018-06-03 DIAGNOSIS — Z885 Allergy status to narcotic agent status: Secondary | ICD-10-CM | POA: Diagnosis not present

## 2018-06-03 DIAGNOSIS — R531 Weakness: Secondary | ICD-10-CM | POA: Diagnosis not present

## 2018-06-03 DIAGNOSIS — I6031 Nontraumatic subarachnoid hemorrhage from right posterior communicating artery: Secondary | ICD-10-CM | POA: Diagnosis not present

## 2018-06-03 DIAGNOSIS — I4510 Unspecified right bundle-branch block: Secondary | ICD-10-CM | POA: Diagnosis not present

## 2018-06-03 DIAGNOSIS — R4182 Altered mental status, unspecified: Secondary | ICD-10-CM | POA: Diagnosis not present

## 2018-06-03 DIAGNOSIS — J9811 Atelectasis: Secondary | ICD-10-CM | POA: Diagnosis not present

## 2018-06-03 DIAGNOSIS — T85695A Other mechanical complication of other nervous system device, implant or graft, initial encounter: Secondary | ICD-10-CM | POA: Diagnosis not present

## 2018-06-03 DIAGNOSIS — I10 Essential (primary) hypertension: Secondary | ICD-10-CM | POA: Diagnosis present

## 2018-06-03 DIAGNOSIS — I619 Nontraumatic intracerebral hemorrhage, unspecified: Secondary | ICD-10-CM | POA: Diagnosis not present

## 2018-06-03 DIAGNOSIS — G9340 Encephalopathy, unspecified: Secondary | ICD-10-CM | POA: Diagnosis present

## 2018-06-03 DIAGNOSIS — Z66 Do not resuscitate: Secondary | ICD-10-CM | POA: Diagnosis not present

## 2018-06-03 DIAGNOSIS — Z4682 Encounter for fitting and adjustment of non-vascular catheter: Secondary | ICD-10-CM | POA: Diagnosis not present

## 2018-06-03 DIAGNOSIS — Z7189 Other specified counseling: Secondary | ICD-10-CM | POA: Diagnosis not present

## 2018-06-03 DIAGNOSIS — Z515 Encounter for palliative care: Secondary | ICD-10-CM | POA: Diagnosis not present

## 2018-06-03 DIAGNOSIS — R0989 Other specified symptoms and signs involving the circulatory and respiratory systems: Secondary | ICD-10-CM | POA: Diagnosis not present

## 2018-06-03 DIAGNOSIS — R9401 Abnormal electroencephalogram [EEG]: Secondary | ICD-10-CM | POA: Diagnosis not present

## 2018-06-03 DIAGNOSIS — J9601 Acute respiratory failure with hypoxia: Secondary | ICD-10-CM | POA: Diagnosis present

## 2018-06-03 DIAGNOSIS — R579 Shock, unspecified: Secondary | ICD-10-CM | POA: Diagnosis not present

## 2018-06-03 DIAGNOSIS — I607 Nontraumatic subarachnoid hemorrhage from unspecified intracranial artery: Secondary | ICD-10-CM | POA: Diagnosis not present

## 2018-06-03 DIAGNOSIS — G911 Obstructive hydrocephalus: Secondary | ICD-10-CM | POA: Diagnosis present

## 2018-06-03 DIAGNOSIS — R9389 Abnormal findings on diagnostic imaging of other specified body structures: Secondary | ICD-10-CM | POA: Diagnosis not present

## 2018-06-03 DIAGNOSIS — E870 Hyperosmolality and hypernatremia: Secondary | ICD-10-CM | POA: Diagnosis not present

## 2018-06-03 DIAGNOSIS — T461X5A Adverse effect of calcium-channel blockers, initial encounter: Secondary | ICD-10-CM | POA: Diagnosis not present

## 2018-06-03 DIAGNOSIS — I609 Nontraumatic subarachnoid hemorrhage, unspecified: Secondary | ICD-10-CM | POA: Diagnosis not present

## 2018-06-03 DIAGNOSIS — I611 Nontraumatic intracerebral hemorrhage in hemisphere, cortical: Secondary | ICD-10-CM | POA: Diagnosis not present

## 2018-06-03 DIAGNOSIS — I639 Cerebral infarction, unspecified: Secondary | ICD-10-CM | POA: Diagnosis not present

## 2018-06-03 DIAGNOSIS — G919 Hydrocephalus, unspecified: Secondary | ICD-10-CM | POA: Diagnosis not present

## 2018-06-03 DIAGNOSIS — T17320A Food in larynx causing asphyxiation, initial encounter: Secondary | ICD-10-CM | POA: Diagnosis not present

## 2018-06-03 DIAGNOSIS — R1310 Dysphagia, unspecified: Secondary | ICD-10-CM | POA: Diagnosis not present

## 2018-06-03 DIAGNOSIS — I6200 Nontraumatic subdural hemorrhage, unspecified: Secondary | ICD-10-CM | POA: Diagnosis not present

## 2018-06-03 DIAGNOSIS — R220 Localized swelling, mass and lump, head: Secondary | ICD-10-CM | POA: Diagnosis not present

## 2018-06-03 DIAGNOSIS — J690 Pneumonitis due to inhalation of food and vomit: Secondary | ICD-10-CM | POA: Diagnosis not present

## 2018-06-03 DIAGNOSIS — G936 Cerebral edema: Secondary | ICD-10-CM | POA: Diagnosis not present

## 2018-06-03 DIAGNOSIS — Z9889 Other specified postprocedural states: Secondary | ICD-10-CM | POA: Diagnosis not present

## 2018-06-03 DIAGNOSIS — G935 Compression of brain: Secondary | ICD-10-CM | POA: Diagnosis not present

## 2018-06-03 DIAGNOSIS — I519 Heart disease, unspecified: Secondary | ICD-10-CM | POA: Diagnosis not present

## 2018-06-03 DIAGNOSIS — R600 Localized edema: Secondary | ICD-10-CM | POA: Diagnosis not present

## 2018-06-03 DIAGNOSIS — I615 Nontraumatic intracerebral hemorrhage, intraventricular: Secondary | ICD-10-CM | POA: Diagnosis not present

## 2018-06-03 DIAGNOSIS — I671 Cerebral aneurysm, nonruptured: Secondary | ICD-10-CM | POA: Diagnosis not present

## 2018-06-03 DIAGNOSIS — D649 Anemia, unspecified: Secondary | ICD-10-CM | POA: Diagnosis not present

## 2018-06-03 DIAGNOSIS — M542 Cervicalgia: Secondary | ICD-10-CM | POA: Diagnosis not present

## 2018-06-03 DIAGNOSIS — K521 Toxic gastroenteritis and colitis: Secondary | ICD-10-CM | POA: Diagnosis not present

## 2018-06-03 DIAGNOSIS — I779 Disorder of arteries and arterioles, unspecified: Secondary | ICD-10-CM | POA: Diagnosis not present

## 2018-06-03 DIAGNOSIS — R51 Headache: Secondary | ICD-10-CM | POA: Diagnosis not present

## 2018-06-03 DIAGNOSIS — G4489 Other headache syndrome: Secondary | ICD-10-CM | POA: Diagnosis not present

## 2018-06-03 DIAGNOSIS — Z853 Personal history of malignant neoplasm of breast: Secondary | ICD-10-CM | POA: Diagnosis not present

## 2018-06-03 DIAGNOSIS — E785 Hyperlipidemia, unspecified: Secondary | ICD-10-CM | POA: Diagnosis present

## 2018-06-05 DIAGNOSIS — R9401 Abnormal electroencephalogram [EEG]: Secondary | ICD-10-CM | POA: Diagnosis not present

## 2018-06-25 DEATH — deceased
